# Patient Record
Sex: Male | Born: 1985 | Race: White | Hispanic: No
Health system: Southern US, Community
[De-identification: ages and names within clinical notes are randomized; demographics above are authoritative.]

---

## 2017-08-26 ENCOUNTER — Other Ambulatory Visit: Payer: Self-pay | Admitting: Gastroenterology

## 2017-08-26 DIAGNOSIS — R945 Abnormal results of liver function studies: Principal | ICD-10-CM

## 2017-08-26 DIAGNOSIS — R7989 Other specified abnormal findings of blood chemistry: Secondary | ICD-10-CM

## 2017-08-26 NOTE — Progress Notes (Signed)
Carmie Lanpher MD 

## 2017-09-15 ENCOUNTER — Ambulatory Visit
Admission: RE | Admit: 2017-09-15 | Discharge: 2017-09-15 | Disposition: A | Discharge status: Home / Self Care | Payer: Self-pay | Source: Ambulatory Visit | Attending: Gastroenterology | Admitting: Gastroenterology

## 2017-09-15 DIAGNOSIS — R7989 Other specified abnormal findings of blood chemistry: Secondary | ICD-10-CM

## 2017-09-15 DIAGNOSIS — R945 Abnormal results of liver function studies: Principal | ICD-10-CM

## 2019-05-20 IMAGING — US US ABDOMEN LIMITED
1 series · 14 of 25 positions shown · non-contrast
Comparison: None.

CLINICAL DATA: Elevated LFTs

EXAM:
ULTRASOUND ABDOMEN LIMITED RIGHT UPPER QUADRANT

[Series 1: us abdomen limited · 0.17mm/px · 14 of 41 slices shown]
[im 1/41]
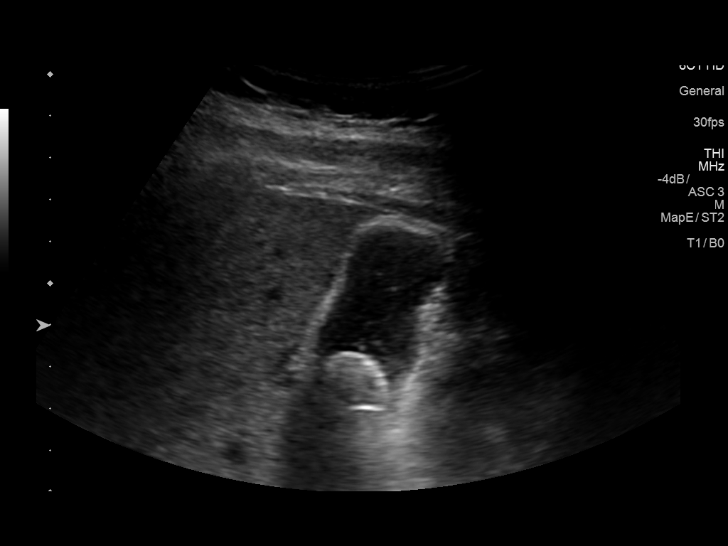
[im 4/41]
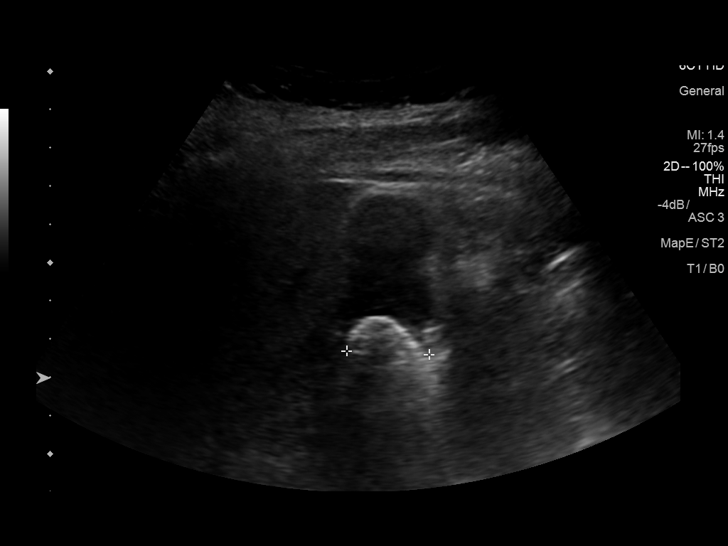
[im 7/41]
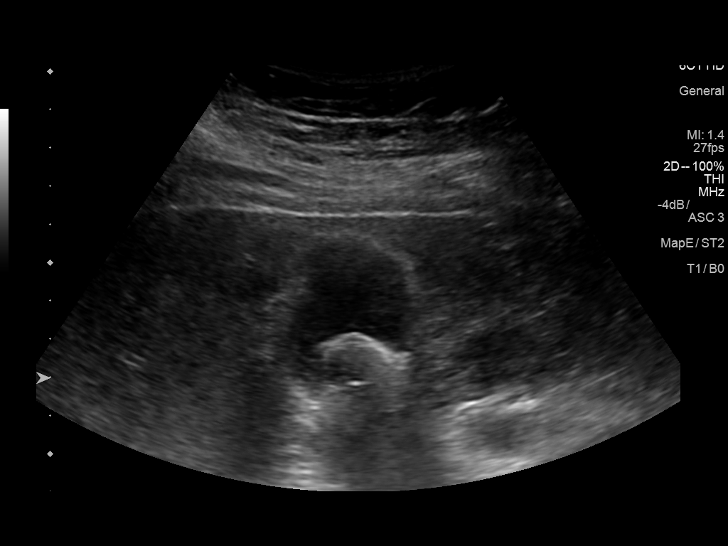
[im 11/41]
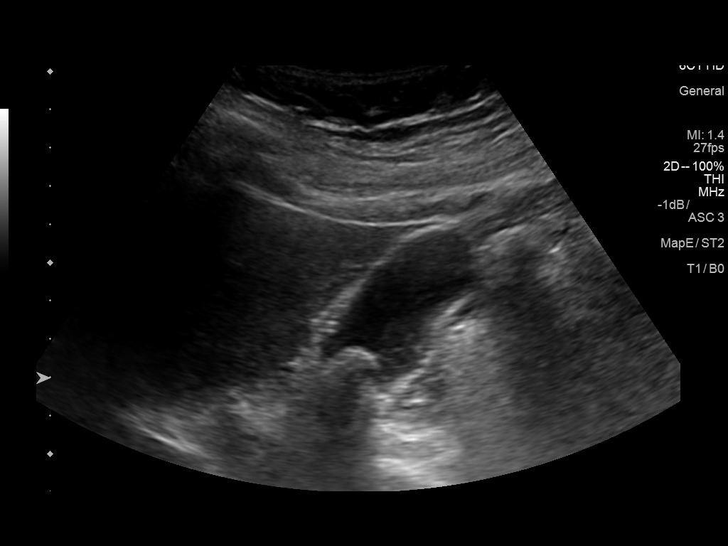
[im 14/41]
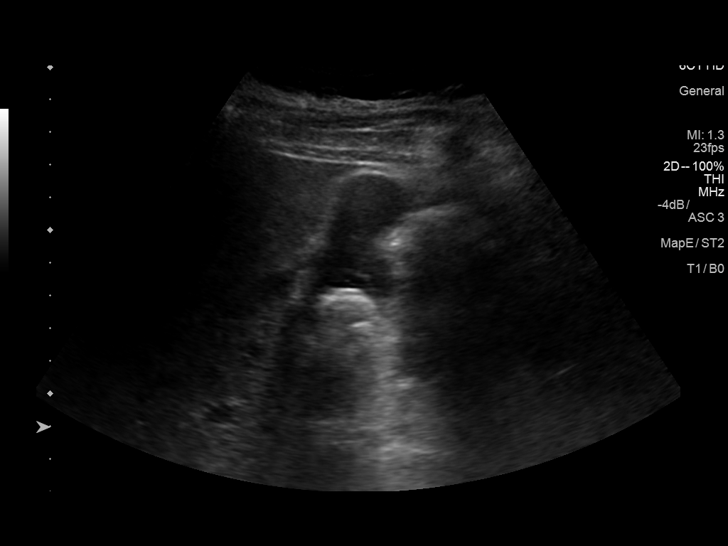
[im 16/41]
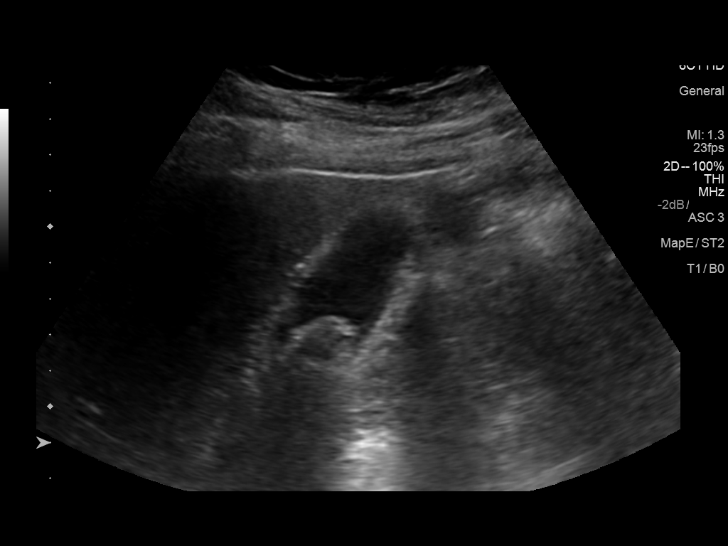
[im 19/41]
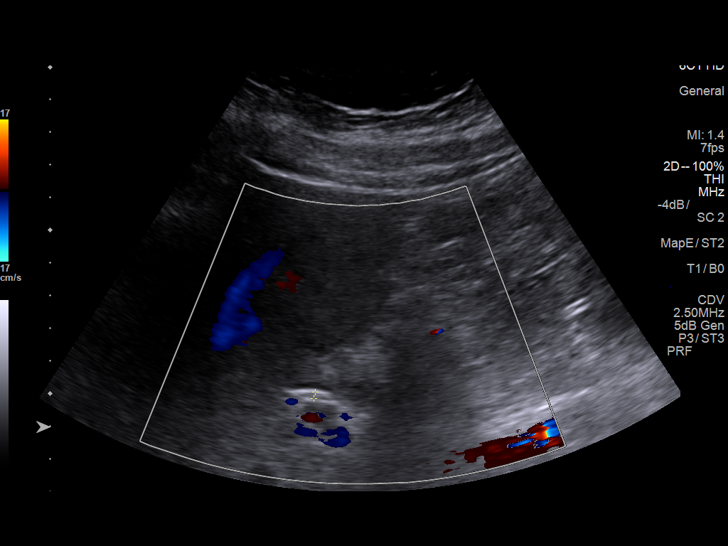
[im 22/41]
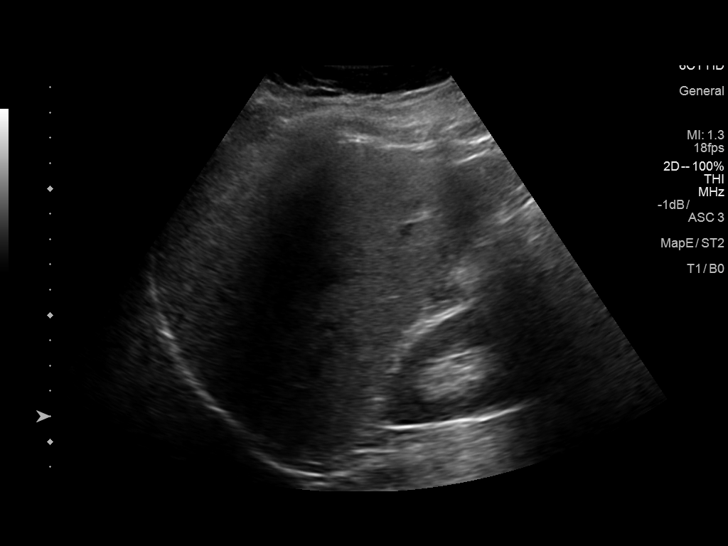
[im 26/41]
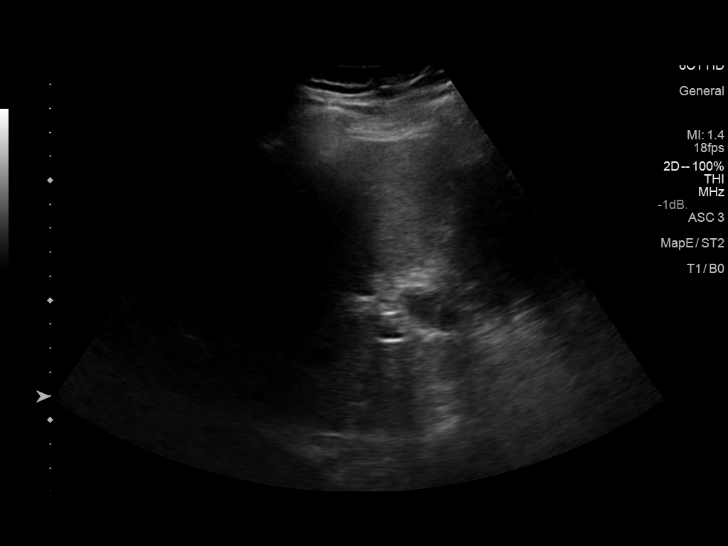
[im 27/41]
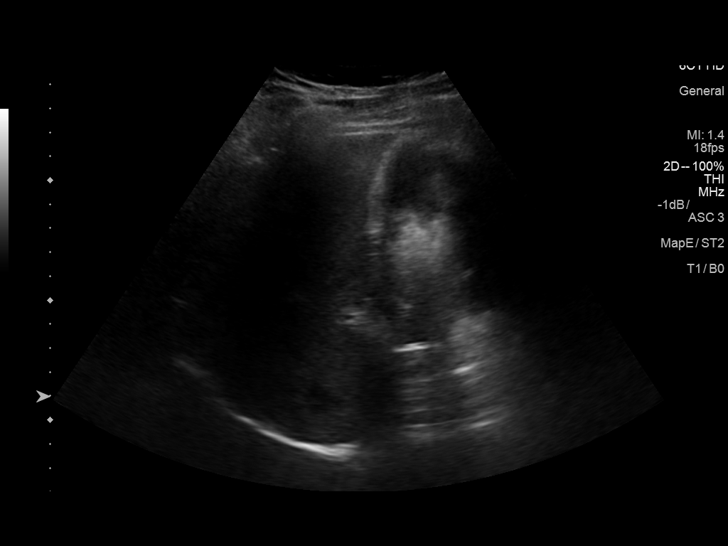
[im 31/41]
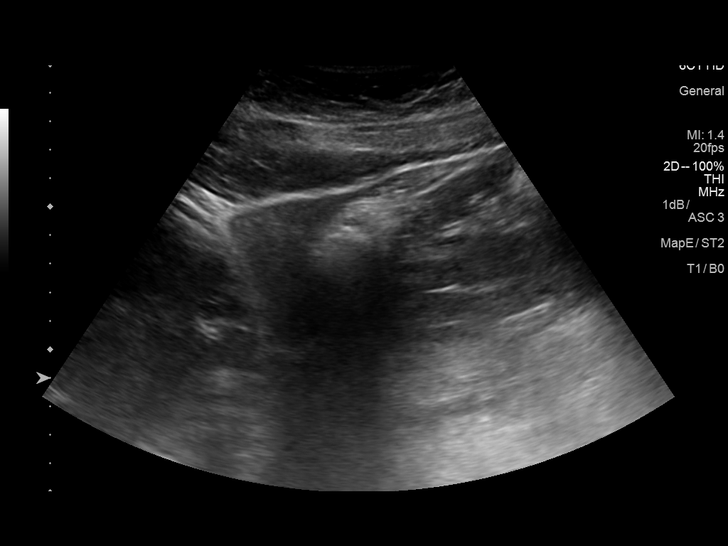
[im 34/41]
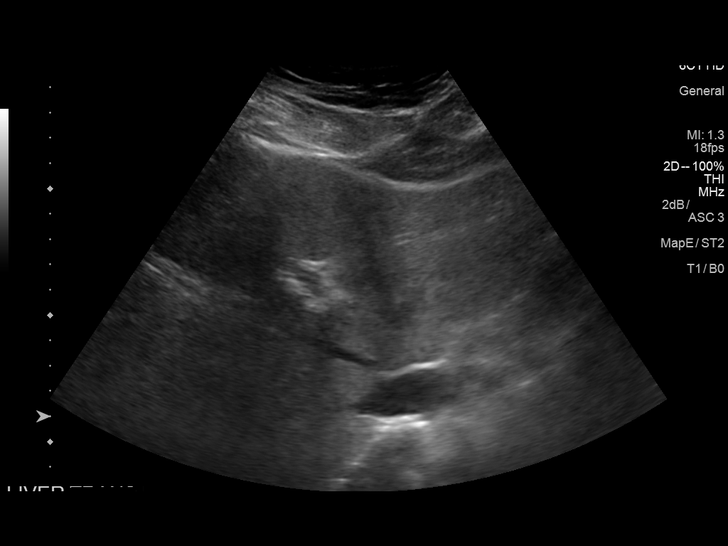
[im 37/41]
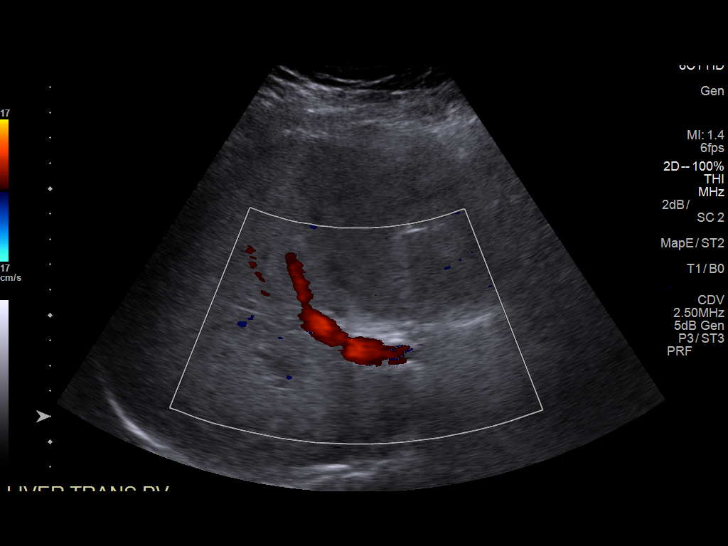
[im 41/41]
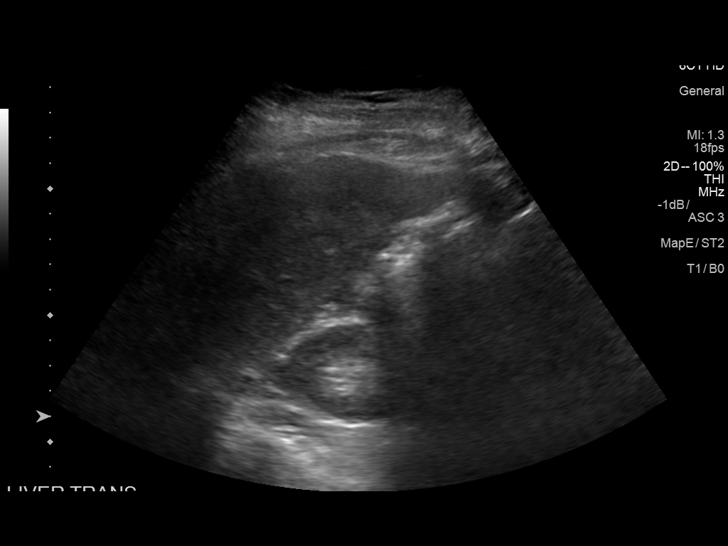

[14 of 25 positions shown; findings below may reference images not displayed]

FINDINGS: Gallbladder:

Gallbladder is well distended with a large 2.2 cm calculus within.
No wall thickening or pericholecystic fluid is noted. No biliary
ductal dilatation is seen.

Common bile duct:

Diameter: 2 mm.

Liver:

No focal lesion identified. Within normal limits in parenchymal
echogenicity. Portal vein is patent on color Doppler imaging with
normal direction of blood flow towards the liver.
IMPRESSION: Cholelithiasis without complicating factors.
